# Patient Record
Sex: Male | Born: 1985 | Race: Black or African American | Hispanic: No | Marital: Single | Smoking: Current every day smoker
Health system: Southern US, Community
[De-identification: ages and names within clinical notes are randomized; demographics above are authoritative.]

## PROBLEM LIST (undated history)

## (undated) DIAGNOSIS — S0990XA Unspecified injury of head, initial encounter: Secondary | ICD-10-CM

---

## 2014-01-25 DIAGNOSIS — S0990XA Unspecified injury of head, initial encounter: Secondary | ICD-10-CM

## 2014-01-25 HISTORY — DX: Unspecified injury of head, initial encounter: S09.90XA

## 2014-02-20 ENCOUNTER — Emergency Department (HOSPITAL_COMMUNITY)
Admission: EM | Admit: 2014-02-20 | Discharge: 2014-02-20 | Disposition: A | Discharge status: Home / Self Care | Payer: Self-pay | Attending: Emergency Medicine | Admitting: Emergency Medicine

## 2014-02-20 ENCOUNTER — Emergency Department (HOSPITAL_COMMUNITY): Payer: Self-pay

## 2014-02-20 ENCOUNTER — Encounter (HOSPITAL_COMMUNITY): Payer: Self-pay | Admitting: Emergency Medicine

## 2014-02-20 DIAGNOSIS — S63610A Unspecified sprain of right index finger, initial encounter: Secondary | ICD-10-CM | POA: Insufficient documentation

## 2014-02-20 DIAGNOSIS — Y9389 Activity, other specified: Secondary | ICD-10-CM | POA: Insufficient documentation

## 2014-02-20 DIAGNOSIS — Z720 Tobacco use: Secondary | ICD-10-CM | POA: Insufficient documentation

## 2014-02-20 DIAGNOSIS — Y998 Other external cause status: Secondary | ICD-10-CM | POA: Insufficient documentation

## 2014-02-20 DIAGNOSIS — X58XXXA Exposure to other specified factors, initial encounter: Secondary | ICD-10-CM | POA: Insufficient documentation

## 2014-02-20 DIAGNOSIS — Y9289 Other specified places as the place of occurrence of the external cause: Secondary | ICD-10-CM | POA: Insufficient documentation

## 2014-02-20 MED ORDER — NAPROXEN 500 MG PO TABS: 500.0000 mg | ORAL_TABLET | Freq: Two times a day (BID) | ORAL | Status: DC

## 2014-02-20 MED ORDER — NAPROXEN 500 MG PO TABS
500.0000 mg | ORAL_TABLET | Freq: Once | ORAL | Status: AC
  Administered 2014-02-20: 500 mg via ORAL
  Filled 2014-02-20: qty 1

## 2014-02-20 NOTE — ED Provider Notes (Signed)
CSN: [id number]     Arrival date & time 02/20/14  2049 History   None    Chief Complaint  Patient presents with  . Hand Injury   Patient is a 29 y.o. male presenting with hand injury. The history is provided by the patient. No language interpreter was used.  Hand Injury This chart was scribed for non-physician practitioner Evan Madura, PA-C,  working with Audree Camel, MD, by Andrew Au, ED Scribe. This patient was seen in room WTR8/WTR8 and the patient's care was started at 9:24 PM.  Evan Rocha is a 29 y.o. male who presents to the Emergency Department complaining of aching, throbbing, nonradiating right index finger pain and swelling that began today. Pt states he woke up with right hand pain this morning. 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 Pt states after work her applied ice to right hand. Pt is unsure of injury.    History reviewed. No pertinent past medical history. History reviewed. No pertinent past surgical history. History reviewed. No pertinent family history. History  Substance Use Topics  . Smoking status: Current Every Day Smoker -- 0.50 packs/day    Types: Cigarettes  . Smokeless tobacco: Not on file  . Alcohol Use: Yes     Comment: occasionally    Review of Systems  Musculoskeletal: Positive for myalgias.  All other systems reviewed and are negative.   Allergies  Review of patient's allergies indicates no known allergies.  Home Medications   Prior to Admission medications   Medication Sig Start Date End Date Taking? Authorizing Provider  naproxen (NAPROSYN) 500 MG tablet Take 1 tablet (500 mg total) by mouth 2 (two) times daily. 02/20/14   Evan Madura, PA-C   BP 135/78 mmHg  Pulse 90  Temp(Src) 98.7 F (37.1 C) (Oral)  Resp 20  SpO2 98%   Physical Exam  Constitutional: He is oriented to person, place, and time. He appears well-developed and well-nourished. No distress.  HENT:  Head:  Normocephalic and atraumatic.  Eyes: Conjunctivae and EOM are normal. No scleral icterus.  Neck: Normal range of motion.  Cardiovascular: Normal rate, regular rhythm and intact distal pulses.   Distal radial pulse 2+ in the right upper extremity. Capillary refill brisk in all digits of right hand.  Pulmonary/Chest: Effort normal. No respiratory distress.  Musculoskeletal: He exhibits tenderness.       Right hand: He exhibits decreased range of motion (Decreased active range of motion secondary to swelling of right second finger), tenderness, bony tenderness and swelling (right second digit). He exhibits normal capillary refill, no deformity and no laceration. Normal sensation noted. Normal strength noted.       Hands: Swelling to the right second digit with tenderness to palpation to the second PIP joint. Normal passive range of motion of right second finger. No crepitus, deformity, erythema, or heat to touch. 5/5 strength against resistance of FDP, FDS, and extensors of right second digit.  Neurological: He is alert and oriented to person, place, and time. He exhibits normal muscle tone. Coordination normal.  Sensation to light touch intact. Patient able to wiggle all fingers of right hand. Finger to thumb opposition intact.  Skin: Skin is warm and dry. No rash noted. He is not diaphoretic. No erythema. No pallor.  Psychiatric: He has a normal mood and affect. His behavior is normal.  Nursing note and vitals reviewed.   ED Course  Procedures (including critical care time) DIAGNOSTIC STUDIES:  Oxygen Saturation is 100% on RA, normal by my interpretation.    COORDINATION OF CARE: 10:06 PM- Pt advised of plan for treatment and pt agrees.  Labs Review Labs Reviewed - No data to display  Imaging Review Dg Hand Complete Right  02/20/2014   CLINICAL DATA:  Initial encounter for right hand pain mainly in the index finger since this morning. No injury.  EXAM: RIGHT HAND - COMPLETE 3+ VIEW   COMPARISON:  None.  FINDINGS: There is no evidence of fracture or dislocation. There is no evidence of arthropathy or other focal bone abnormality. Soft tissues are unremarkable.  IMPRESSION: Negative.   Electronically Signed   By: Kennith CenterEric  Mansell M.D.   On: 02/20/2014 21:34     EKG Interpretation None      MDM   Final diagnoses:  Sprain of right index finger, initial encounter    29 year old nontoxic-appearing male presents to the emergency department for further evaluation of pain to his right second finger. No known trauma/injury. Patient is neurovascularly intact. Physical exam findings with negative x-ray suggest likely finger sprain. Patient to be treated with NSAIDs and RICE. Splint applied in ED. Return precautions discussed and provided. Patient agreeable to plan with no unaddressed concerns.  I personally performed the services described in this documentation, which was scribed in my presence. The recorded information has been reviewed and is accurate.    Evan MaduraKelly Dray Dente, PA-C 02/20/14 2209  Audree CamelScott T Goldston, MD 02/21/14 [phone]

## 2014-02-20 NOTE — ED Notes (Signed)
Pt presents with right hand and finger swelling. Works where he possibly strained his hand but is unsure of source of injury. Able to move right hand and right arm. No other issues/complaints.

## 2014-02-20 NOTE — Discharge Instructions (Signed)
Finger Sprain A finger sprain is a tear in one of the strong, fibrous tissues that connect the bones (ligaments) in your finger. The severity of the sprain depends on how much of the ligament is torn. The tear can be either partial or complete. CAUSES  Often, sprains are a result of a fall or accident. If you extend your hands to catch an object or to protect yourself, the force of the impact causes the fibers of your ligament to stretch too much. This excess tension causes the fibers of your ligament to tear. SYMPTOMS  You may have some loss of motion in your finger. Other symptoms include:  Bruising.  Tenderness.  Swelling. DIAGNOSIS  In order to diagnose finger sprain, your caregiver will physically examine your finger or thumb to determine how torn the ligament is. Your caregiver may also suggest an X-ray exam of your finger to make sure no bones are broken. TREATMENT  If your ligament is only partially torn, treatment usually involves keeping the finger in a fixed position (immobilization) for a short period. To do this, your caregiver will apply a bandage, cast, or splint to keep your finger from moving until it heals. For a partially torn ligament, the healing process usually takes 2 to 3 weeks. If your ligament is completely torn, you may need surgery to reconnect the ligament to the bone. After surgery a cast or splint will be applied and will need to stay on your finger or thumb for 4 to 6 weeks while your ligament heals. HOME CARE INSTRUCTIONS  Keep your injured finger elevated, when possible, to decrease swelling.  To ease pain and swelling, apply ice to your joint twice a day, for 2 to 3 days:  Put ice in a plastic bag.  Place a towel between your skin and the bag.  Leave the ice on for 15 minutes.  Only take over-the-counter or prescription medicine for pain as directed by your caregiver.  Do not wear rings on your injured finger.  Do not leave your finger unprotected  until pain and stiffness go away (usually 3 to 4 weeks).  Do not allow your cast or splint to get wet. Cover your cast or splint with a plastic bag when you shower or bathe. Do not swim.  Your caregiver may suggest special exercises for you to do during your recovery to prevent or limit permanent stiffness. SEEK IMMEDIATE MEDICAL CARE IF:  Your cast or splint becomes damaged.  Your pain becomes worse rather than better. MAKE SURE YOU:  Understand these instructions.  Will watch your condition.  Will get help right away if you are not doing well or get worse. Document Released: 02/19/2004 Document Revised: 04/05/2011 Document Reviewed: 09/14/2010 [facility] Patient Information 2015 [place], [place]. This information is not intended to replace advice given to you by your health care provider. Make sure you discuss any questions you have with your health care provider. RICE: Routine Care for Injuries The routine care of many injuries includes Rest, Ice, Compression, and Elevation (RICE). HOME CARE INSTRUCTIONS  Rest is needed to allow your body to heal. Routine activities can usually be resumed when comfortable. Injured tendons and bones can take up to 6 weeks to heal. Tendons are the cord-like structures that attach muscle to bone.  Ice following an injury helps keep the swelling down and reduces pain.  Put ice in a plastic bag.  Place a towel between your skin and the bag.  Leave the ice on for 15-20  minutes, 3-4 times a day, or as directed by your health care provider. Do this while awake, for the first 24 to 48 hours. After that, continue as directed by your caregiver.  Compression helps keep swelling down. It also gives support and helps with discomfort. If an elastic bandage has been applied, it should be removed and reapplied every 3 to 4 hours. It should not be applied tightly, but firmly enough to keep swelling down. Watch fingers or toes for swelling, bluish discoloration,  coldness, numbness, or excessive pain. If any of these problems occur, remove the bandage and reapply loosely. Contact your caregiver if these problems continue.  Elevation helps reduce swelling and decreases pain. With extremities, such as the arms, hands, legs, and feet, the injured area should be placed near or above the level of the heart, if possible. SEEK IMMEDIATE MEDICAL CARE IF:  You have persistent pain and swelling.  You develop redness, numbness, or unexpected weakness.  Your symptoms are getting worse rather than improving after several days. These symptoms may indicate that further evaluation or further X-rays are needed. Sometimes, X-rays may not show a small broken bone (fracture) until 1 week or 10 days later. Make a follow-up appointment with your caregiver. Ask when your X-ray results will be ready. Make sure you get your X-ray results. Document Released: 04/25/2000 Document Revised: 01/16/2013 Document Reviewed: 06/12/2010 [facility] Patient Information 2015 [place], [place]. This information is not intended to replace advice given to you by your health care provider. Make sure you discuss any questions you have with your health care provider. Buddy Taping You have a minor finger or toe injury. It can be managed by buddy taping. Buddy taping means the injured finger or toe is taped to a healthy uninjured adjacent finger or toe. Most minor fractures and dislocations of the smaller fingers and toes will heal in 3 to 4 weeks. Buddy taping immobilizes and protects the area of injury. Buddy taping is not recommended for initial treatment of fractures of the thumb, longer fingers, or the great toe. Buddy taping should not be used for unstable or deformed fractures, but as fracture healing progresses it may be used for protection during rehabilitation. Fractured fingers and toes should be protected by buddy taping as long as the injury is still painful or swollen.  When an injury is buddy  taped, place a small piece of gauze or cotton between the digits that are taped. This helps prevent the skin from breaking down from increased moisture. Buddy taping allows you to get your injury wet when you bathe. Change the gauze and tape more often if it gets wet, and dry the space between the finger or toes. Use a sturdy, hard-soled shoe for better support if you have a fractured toe. In 2 to 3 weeks you can start motion exercises. This will keep the fingers or toes from becoming stiff.  SEEK IMMEDIATE MEDICAL CARE IF:   The injured area becomes cold, numb, or pale.  You have pain not controlled with medications.  You notice increasing deformity of the toe or finger. Document Released: 02/19/2004 Document Revised: 04/05/2011 Document Reviewed: 06/19/2008 [facility]Exit[facility] Patient Information 2015 [place], [place]. This information is not intended to replace advice given to you by your health care provider. Make sure you discuss any questions you have with your health care provider.

## 2014-06-26 ENCOUNTER — Emergency Department (HOSPITAL_COMMUNITY)
Admission: EM | Admit: 2014-06-26 | Discharge: 2014-06-26 | Disposition: A | Discharge status: Home / Self Care | Payer: Self-pay | Attending: Emergency Medicine | Admitting: Emergency Medicine

## 2014-06-26 ENCOUNTER — Encounter (HOSPITAL_COMMUNITY): Payer: Self-pay | Admitting: Emergency Medicine

## 2014-06-26 DIAGNOSIS — Z87828 Personal history of other (healed) physical injury and trauma: Secondary | ICD-10-CM | POA: Insufficient documentation

## 2014-06-26 DIAGNOSIS — Z139 Encounter for screening, unspecified: Secondary | ICD-10-CM

## 2014-06-26 DIAGNOSIS — Z791 Long term (current) use of non-steroidal anti-inflammatories (NSAID): Secondary | ICD-10-CM | POA: Insufficient documentation

## 2014-06-26 DIAGNOSIS — Z0000 Encounter for general adult medical examination without abnormal findings: Secondary | ICD-10-CM | POA: Insufficient documentation

## 2014-06-26 DIAGNOSIS — Z720 Tobacco use: Secondary | ICD-10-CM | POA: Insufficient documentation
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
## 2014-06-28 ENCOUNTER — Telehealth: Payer: Self-pay | Admitting: Neurology

## 2014-06-28 ENCOUNTER — Ambulatory Visit: Payer: Self-pay | Admitting: Neurology

## 2014-06-28 NOTE — Telephone Encounter (Signed)
This patient did not show for a new patient appointment today. 

## 2014-07-02 ENCOUNTER — Encounter: Payer: Self-pay | Admitting: Neurology

## 2015-03-21 ENCOUNTER — Emergency Department (HOSPITAL_COMMUNITY): Payer: Self-pay

## 2015-03-21 ENCOUNTER — Encounter (HOSPITAL_COMMUNITY): Payer: Self-pay | Admitting: Oncology

## 2015-03-21 ENCOUNTER — Emergency Department (HOSPITAL_COMMUNITY)
Admission: EM | Admit: 2015-03-21 | Discharge: 2015-03-21 | Disposition: A | Discharge status: Home / Self Care | Payer: Self-pay | Attending: Emergency Medicine | Admitting: Emergency Medicine

## 2015-03-21 DIAGNOSIS — F17210 Nicotine dependence, cigarettes, uncomplicated: Secondary | ICD-10-CM | POA: Insufficient documentation

## 2015-03-21 DIAGNOSIS — J40 Bronchitis, not specified as acute or chronic: Secondary | ICD-10-CM

## 2015-03-21 DIAGNOSIS — J209 Acute bronchitis, unspecified: Secondary | ICD-10-CM | POA: Insufficient documentation

## 2015-03-21 DIAGNOSIS — Z87828 Personal history of other (healed) physical injury and trauma: Secondary | ICD-10-CM | POA: Insufficient documentation

## 2015-03-21 DIAGNOSIS — Z791 Long term (current) use of non-steroidal anti-inflammatories (NSAID): Secondary | ICD-10-CM | POA: Insufficient documentation

## 2015-03-21 HISTORY — DX: Unspecified injury of head, initial encounter: S09.90XA

## 2015-03-21 MED ORDER — ALBUTEROL SULFATE HFA 108 (90 BASE) MCG/ACT IN AERS
2.0000 | INHALATION_SPRAY | RESPIRATORY_TRACT | Status: DC | PRN
  Administered 2015-03-21: 2 via RESPIRATORY_TRACT
  Filled 2015-03-21: qty 6.7

## 2015-03-21 MED ORDER — IPRATROPIUM-ALBUTEROL 0.5-2.5 (3) MG/3ML IN SOLN
3.0000 mL | Freq: Once | RESPIRATORY_TRACT | Status: AC
  Administered 2015-03-21: 3 mL via RESPIRATORY_TRACT
  Filled 2015-03-21: qty 3

## 2015-03-21 MED ORDER — AZITHROMYCIN 250 MG PO TABS
500.0000 mg | ORAL_TABLET | Freq: Once | ORAL | Status: AC
  Administered 2015-03-21: 500 mg via ORAL
  Filled 2015-03-21: qty 2

## 2015-03-21 MED ORDER — AZITHROMYCIN 250 MG PO TABS: 250.0000 mg | ORAL_TABLET | Freq: Every day | ORAL | Status: DC

## 2015-03-21 NOTE — Discharge Instructions (Signed)

## 2015-03-21 NOTE — ED Notes (Signed)
Per pt he has had cough, body aches and sinus congestion.  Pt took mucinex at home w/o relief.

## 2015-03-21 NOTE — ED Provider Notes (Signed)
CSN: [id number]     Arrival date & time 03/21/15  2106 History  By signing my name below, I, Evan Rocha, attest that this documentation has been prepared under the direction and in the presence of Marlon Pel, PA-C. Electronically Signed: [name], Scribe. 03/21/2015. 10:48 PM.   Chief Complaint  Patient presents with  . URI   The history is provided by the patient. No language interpreter was used.   HPI Comments: Evan Rocha is a 30 y.o. male who presents to the Emergency Department complaining of sudden onset, constant, mild cough, generalized body aches, and sinus congestion which began four days ago but worsened three nights ago. He also reports associated minor edema of his throat, chills and diaphoresis, especially during the evenings. Pt has taken Mucinex, DayQuil, sinus relief, and cough drops at home with no relief.  Pt denies trouble swallowing, sore throat, and hx of asthma.   PCP: No primary care provider on file.  Blood pressure 136/92, pulse 94, temperature 99.1 F (37.3 C), temperature source Oral, resp. rate 20, height  (1.778 m), weight 108.863 kg, SpO2 95 %.  Negative ROS: Confusion, diaphoresis, fever, headache, lethargy, vision change, neck pain, dysphagia, aphagia, drooling, stridor, chest pain, shortness of breath,  back pain, abdominal pains, nausea, vomiting, constipation, dysuria, loc, diarrhea, lower extremity swelling, rash.   Past Medical History  Diagnosis Date  . Head injury 2016   History reviewed. No pertinent past surgical history. History reviewed. No pertinent family history. Social History  Substance Use Topics  . Smoking status: Current Every Day Smoker -- 0.50 packs/day    Types: Cigarettes  . Smokeless tobacco: Never Used  . Alcohol Use: Yes     Comment: occasionally    Review of Systems  Constitutional: Positive for chills and diaphoresis.  HENT: Positive for congestion. Negative for sore throat and trouble  swallowing.   Respiratory: Positive for cough.   Musculoskeletal: Positive for myalgias ( generalized body aches).  All other systems reviewed and are negative.  Allergies  Review of patient's allergies indicates no known allergies.  Home Medications   Prior to Admission medications   Medication Sig Start Date End Date Taking? Authorizing Provider  azithromycin (ZITHROMAX) 250 MG tablet Take 1 tablet (250 mg total) by mouth daily. Take first 2 tablets together, then 1 every day until finished. 03/21/15   Coron Rossano Neva Seat, PA-C  naproxen (NAPROSYN) 500 MG tablet Take 1 tablet (500 mg total) by mouth 2 (two) times daily. 02/20/14   Antony Madura, PA-C   BP 136/92 mmHg  Pulse 94  Temp(Src) 99.1 F (37.3 C) (Oral)  Resp 20  Ht  (1.778 m)  Wt 108.863 kg  BMI 34.44 kg/m2  SpO2 95% Physical Exam  Constitutional: He is oriented to person, place, and time. He appears well-developed and well-nourished. No distress.  HENT:  Head: Normocephalic and atraumatic.  Eyes: Conjunctivae are normal.  Cardiovascular: Normal rate, regular rhythm and normal heart sounds.   Pulmonary/Chest: Effort normal. No accessory muscle usage. No respiratory distress. He has no decreased breath sounds. He has wheezes. He has rhonchi. He has no rales.  Abdominal: Soft. He exhibits no distension. There is no tenderness.  Neurological: He is alert and oriented to person, place, and time.  Skin: Skin is warm and dry.  Psychiatric: He has a normal mood and affect.  Nursing note and vitals reviewed.   ED Course  Procedures  DIAGNOSTIC STUDIES:    Oxygen Saturation is 95%  on RA, adequate by my interpretation.   COORDINATION OF CARE:  10:48 PM Will review x-ray of pt's chest. Will treat pt with antibiotic and will administer pt breathing treatment in the ED. Will also prescribe pt nebulizer. Pt administered nebulizer in the ED. Discussed treatment plan with pt at bedside and pt agreed to plan.   Imaging  Review Dg Chest 2 View  03/21/2015  CLINICAL DATA:  30 year old presenting with 3 day history of cough, shortness of breath, myalgias and sinonasal congestion. Occasional smoker. EXAM: CHEST  2 VIEW COMPARISON:  None. FINDINGS: Cardiac silhouette upper normal in size. Hilar and mediastinal contours unremarkable otherwise. Lungs clear. Bronchovascular markings normal. Pulmonary vascularity normal. No visible pleural effusions. No pneumothorax. Visualized bony thorax intact. IMPRESSION: Borderline heart size.  No acute cardiopulmonary disease. Electronically Signed   By: Hulan Saas M.D.   On: 03/21/2015 21:48   I have personally reviewed and evaluated these images as part of my medical decision-making.  MDM   Final diagnoses:  Bronchitis    Pt with wheezing and rhonchi on exam. Clinically has bronchitis.  Medications  azithromycin (ZITHROMAX) tablet 500 mg (not administered)  albuterol (PROVENTIL HFA;VENTOLIN HFA) 108 (90 Base) MCG/ACT inhaler 2 puff (not administered)  ipratropium-albuterol (DUONEB) 0.5-2.5 (3) MG/3ML nebulizer solution 3 mL (3 mLs Nebulization Given 03/21/15 2250)   F/u with PCP in 7-14 days.  Return precautions discussed.   I feel the patient has had an appropriate workup for their chief complaint at this time and likelihood of emergent condition existing is low. Discussed s/sx that warrant return to the ED.  Filed Vitals:   03/21/15 2121  BP: 136/92  Pulse: 94  Temp: 99.1 F (37.3 C)  Resp: [address], PA-C 03/21/15 2313  Derwood Kaplan, MD 03/22/15 1536

## 2015-07-18 ENCOUNTER — Ambulatory Visit: Payer: Self-pay

## 2015-07-18 ENCOUNTER — Other Ambulatory Visit: Payer: Self-pay | Admitting: Occupational Medicine

## 2015-07-18 DIAGNOSIS — Z0000 Encounter for general adult medical examination without abnormal findings: Secondary | ICD-10-CM

## 2015-09-10 ENCOUNTER — Encounter (HOSPITAL_COMMUNITY): Payer: Self-pay | Admitting: Emergency Medicine

## 2015-09-10 ENCOUNTER — Emergency Department (HOSPITAL_COMMUNITY): Payer: Self-pay

## 2015-09-10 ENCOUNTER — Emergency Department (HOSPITAL_COMMUNITY)
Admission: EM | Admit: 2015-09-10 | Discharge: 2015-09-10 | Disposition: A | Discharge status: Home / Self Care | Payer: Self-pay | Attending: Emergency Medicine | Admitting: Emergency Medicine

## 2015-09-10 DIAGNOSIS — S6291XA Unspecified fracture of right wrist and hand, initial encounter for closed fracture: Secondary | ICD-10-CM

## 2015-09-10 DIAGNOSIS — W228XXA Striking against or struck by other objects, initial encounter: Secondary | ICD-10-CM | POA: Insufficient documentation

## 2015-09-10 DIAGNOSIS — Y999 Unspecified external cause status: Secondary | ICD-10-CM | POA: Insufficient documentation

## 2015-09-10 DIAGNOSIS — S62390A Other fracture of second metacarpal bone, right hand, initial encounter for closed fracture: Secondary | ICD-10-CM | POA: Insufficient documentation

## 2015-09-10 DIAGNOSIS — Y929 Unspecified place or not applicable: Secondary | ICD-10-CM | POA: Insufficient documentation

## 2015-09-10 DIAGNOSIS — Z791 Long term (current) use of non-steroidal anti-inflammatories (NSAID): Secondary | ICD-10-CM | POA: Insufficient documentation

## 2015-09-10 DIAGNOSIS — Y939 Activity, unspecified: Secondary | ICD-10-CM | POA: Insufficient documentation

## 2015-09-10 DIAGNOSIS — F17210 Nicotine dependence, cigarettes, uncomplicated: Secondary | ICD-10-CM | POA: Insufficient documentation

## 2015-09-10 NOTE — ED Notes (Signed)
Ortho tech called to apply splint. °

## 2015-09-10 NOTE — ED Triage Notes (Signed)
Pt states his right hand got slammed in a car door yesterday  Pt states he has iced and elevated it but the pain and swelling continue this morning

## 2015-09-10 NOTE — ED Provider Notes (Signed)
[facility]-EMERGENCY DEPT Provider Note   CSN: [id number] Arrival date & time: 09/10/15  [id number]     History   Chief Complaint Chief Complaint  Patient presents with  . Hand Injury    HPI Evan Rocha is a 30 y.o. male.  HPI  Patient presents with concern of pain in the dorsum of the right hand. If the presence since yesterday, after his hand was stuck in a door. Pain is focally about the hand, with no radiation, worse with motion, palpation of the area. No other injuries, no other complaints. No medication taken for pain relief, pain is sore.  Past Medical History:  Diagnosis Date  . Head injury 2016    There are no active problems to display for this patient.   History reviewed. No pertinent surgical history.     Home Medications    Prior to Admission medications   Medication Sig Start Date End Date Taking? Authorizing Provider  azithromycin (ZITHROMAX) 250 MG tablet Take 1 tablet (250 mg total) by mouth daily. Take first 2 tablets together, then 1 every day until finished. 03/21/15   Tiffany Neva SeatGreene, PA-C  naproxen (NAPROSYN) 500 MG tablet Take 1 tablet (500 mg total) by mouth 2 (two) times daily. 02/20/14   Antony MaduraKelly Humes, PA-C    Family History Family History  Problem Relation Age of Onset  . Hypertension Other     Social History Social History  Substance Use Topics  . Smoking status: Current Every Day Smoker    Packs/day: 0.50    Types: Cigarettes  . Smokeless tobacco: Never Used  . Alcohol use Yes     Comment: occasionally     Allergies   Review of patient's allergies indicates no known allergies.   Review of Systems Review of Systems  Constitutional:       Per HPI, otherwise negative  HENT:       Per HPI, otherwise negative  Respiratory:       Per HPI, otherwise negative  Cardiovascular:       Per HPI, otherwise negative  Gastrointestinal: Negative for vomiting.  Endocrine:       Negative aside from HPI  Genitourinary:       Neg aside from  HPI   Musculoskeletal:       Per HPI, otherwise negative  Skin: Negative.   Neurological: Negative for syncope.     Physical Exam Updated Vital Signs BP 142/82 (BP Location: Left Arm)   Pulse 83   Temp 98.7 F (37.1 C) (Oral)   Resp 16   Ht 5\' 10"  (1.778 m)   Wt 245 lb (111.1 kg)   SpO2 97%   BMI 35.15 kg/m   Physical Exam  Constitutional: He is oriented to person, place, and time. He appears well-developed. No distress.  HENT:  Head: Normocephalic and atraumatic.  Eyes: Conjunctivae and EOM are normal.  Cardiovascular: Normal rate and regular rhythm.   Pulmonary/Chest: Effort normal. No stridor. No respiratory distress.  Abdominal: He exhibits no distension.  Musculoskeletal: He exhibits no edema.       Right wrist: He exhibits normal range of motion, no tenderness, no bony tenderness and no swelling.       Arms: Neurological: He is alert and oriented to person, place, and time.  Skin: Skin is warm and dry.  Psychiatric: He has a normal mood and affect.  Nursing note and vitals reviewed.     Radiology Dg Hand Complete Right  Result Date: 09/10/2015 CLINICAL DATA:  Persistent  pain after closing hand in car door 1 day prior EXAM: RIGHT HAND - COMPLETE 3+ VIEW COMPARISON:  February 20, 2014 FINDINGS: Frontal oblique and lateral views were obtained. There is a nondisplaced spiral type fracture involving portions of the proximal and distal aspects of the second metacarpal. No other fracture. No dislocation. Joint spaces appear intact. IMPRESSION: Nondisplaced spiral type fracture second metacarpal. No other fracture. No dislocation. No apparent arthropathy Electronically Signed   By: Bretta BangWilliam  Woodruff III M.D.   On: 09/10/2015 07:57    Procedures ORTHOPEDIC INJURY TREATMENT Date/Time: 09/10/2015 9:11 AM Performed by: Gerhard MunchLOCKWOOD, Dequante Tremaine Authorized by: Gerhard MunchLOCKWOOD, Hutson Luft  Consent: Verbal consent obtained. Risks and benefits: risks, benefits and alternatives were  discussed Consent given by: patient Patient understanding: patient states understanding of the procedure being performed Patient consent: the patient's understanding of the procedure matches consent given Procedure consent: procedure consent matches procedure scheduled Relevant documents: relevant documents present and verified Test results: test results available and properly labeled Site marked: the operative site was marked Imaging studies: imaging studies available Required items: required blood products, implants, devices, and special equipment available Patient identity confirmed: verbally with patient Time out: Immediately prior to procedure a "time out" was called to verify the correct patient, procedure, equipment, support staff and site/side marked as required. Injury location: hand Location details: right hand Injury type: fracture Fracture type: second metacarpal Pre-procedure neurovascular assessment: neurovascularly intact Pre-procedure distal perfusion: normal Pre-procedure neurological function: normal Pre-procedure range of motion: reduced  Anesthesia: Local anesthesia used: no  Sedation: Patient sedated: no Manipulation performed: no Immobilization: splint Splint type: radial gutter Supplies used: Ortho-Glass Post-procedure neurovascular assessment: post-procedure neurovascularly intact Post-procedure distal perfusion: normal Post-procedure neurological function: normal Post-procedure range of motion: unchanged Patient tolerance: Patient tolerated the procedure well with no immediate complications Comments: Procedure performed with the ortho tech.  Well tolerated    (including critical care time)  I reviewed the patient's x-ray imaging with him.  We discussed the need for splint, orthopedic follow-up.  Patient tolerated splinting of his hand fracture well   Initial Impression / Assessment and Plan / ED Course  I have reviewed the triage vital signs  and the nursing notes.  Pertinent labs & imaging results that were available during my care of the patient were reviewed by me and considered in my medical decision making (see chart for details).  Patient is generally well presenting with new right hand pain following minor trauma yesterday. Here the patient has no distal neurovascular deficits, does have fracture. Patient had successful immobilization with splint, this was well tolerated. Patient discharged in stable condition to follow-up with orthopedics.    Gerhard Munchobert Shalia Bartko, MD 09/10/15 [phone]

## 2015-12-30 ENCOUNTER — Emergency Department (HOSPITAL_COMMUNITY)
Admission: EM | Admit: 2015-12-30 | Discharge: 2015-12-30 | Disposition: A | Discharge status: Home / Self Care | Payer: Self-pay | Attending: Emergency Medicine | Admitting: Emergency Medicine

## 2015-12-30 ENCOUNTER — Encounter (HOSPITAL_COMMUNITY): Payer: Self-pay | Admitting: Emergency Medicine

## 2015-12-30 DIAGNOSIS — F17210 Nicotine dependence, cigarettes, uncomplicated: Secondary | ICD-10-CM | POA: Insufficient documentation

## 2015-12-30 DIAGNOSIS — K0889 Other specified disorders of teeth and supporting structures: Secondary | ICD-10-CM | POA: Insufficient documentation

## 2015-12-30 MED ORDER — NAPROXEN 500 MG PO TABS: 500.0000 mg | ORAL_TABLET | Freq: Two times a day (BID) | ORAL | 0 refills | Status: DC

## 2015-12-30 MED ORDER — NAPROXEN 500 MG PO TABS
500.0000 mg | ORAL_TABLET | Freq: Once | ORAL | Status: AC
  Administered 2015-12-30: 500 mg via ORAL
  Filled 2015-12-30: qty 1

## 2015-12-30 MED ORDER — PENICILLIN V POTASSIUM 500 MG PO TABS
500.0000 mg | ORAL_TABLET | Freq: Once | ORAL | Status: AC
  Administered 2015-12-30: 500 mg via ORAL
  Filled 2015-12-30: qty 1

## 2015-12-30 MED ORDER — ACETAMINOPHEN 500 MG PO TABS
1000.0000 mg | ORAL_TABLET | Freq: Once | ORAL | Status: AC
  Administered 2015-12-30: 1000 mg via ORAL
  Filled 2015-12-30: qty 2

## 2015-12-30 MED ORDER — ACETAMINOPHEN 500 MG PO TABS: 1000.0000 mg | ORAL_TABLET | Freq: Four times a day (QID) | ORAL | 0 refills | Status: DC | PRN

## 2015-12-30 MED ORDER — PENICILLIN V POTASSIUM 500 MG PO TABS: 500.0000 mg | ORAL_TABLET | Freq: Four times a day (QID) | ORAL | 0 refills | Status: AC

## 2015-12-30 NOTE — ED Provider Notes (Signed)
[facility]-EMERGENCY DEPT Provider Note   CSN: [id number] Arrival date & time: 12/30/15  1501  By signing my name below, I, Javier Dockerobert Ryan Halas, attest that this documentation has been prepared under the direction and in the presence of Cheri FowlerKayla Simisola Sandles, PA-C. Electronically Signed: Javier Dockerobert Ryan Halas, ER Scribe. 09/06/2015. 3:23 PM.  History   Chief Complaint Chief Complaint  Patient presents with  . Dental Pain   HPI  HPI Comments: Evan Rocha is a 30 y.o. male who presents to the Emergency Department complaining of gradual onset, constant, moderate upper right sided tooth pain with associated subjective fever and right sided facial swelling. The dental pain started 10 days ago and has steadily worsened. He states he felt warm last night. He denies dysphagia, choking, drooling, tongue swelling, or neck pain. No allergies.   Past Medical History:  Diagnosis Date  . Head injury 2016    There are no active problems to display for this patient.   History reviewed. No pertinent surgical history.   Home Medications    Prior to Admission medications   Medication Sig Start Date End Date Taking? Authorizing Provider  acetaminophen (TYLENOL) 500 MG tablet Take 2 tablets (1,000 mg total) by mouth every 6 (six) hours as needed. 12/30/15   Cheri FowlerKayla Baldo Hufnagle, PA-C  azithromycin (ZITHROMAX) 250 MG tablet Take 1 tablet (250 mg total) by mouth daily. Take first 2 tablets together, then 1 every day until finished. 03/21/15   Tiffany Neva SeatGreene, PA-C  naproxen (NAPROSYN) 500 MG tablet Take 1 tablet (500 mg total) by mouth 2 (two) times daily. 12/30/15   Cheri FowlerKayla Gelene Recktenwald, PA-C  penicillin v potassium (VEETID) 500 MG tablet Take 1 tablet (500 mg total) by mouth 4 (four) times daily. 12/30/15 01/06/16  Cheri FowlerKayla Reilly Blades, PA-C    Family History Family History  Problem Relation Age of Onset  . Hypertension Other     Social History Social History  Substance Use Topics  . Smoking status: Current Every Day Smoker    Packs/day: 0.50    Types: Cigarettes  . Smokeless tobacco: Never Used  . Alcohol use Yes     Comment: occasionally     Allergies   Patient has no known allergies.   Review of Systems Review of Systems  Constitutional: Positive for fever. Negative for chills.  HENT: Positive for dental problem. Negative for ear pain and sinus pain.   Gastrointestinal: Negative for nausea and vomiting.  All other systems reviewed and are negative.    Physical Exam Updated Vital Signs BP 128/96 (BP Location: Right Arm)   Pulse 63   Temp 98.3 F (36.8 C) (Oral)   Resp 18   Ht 5\' 10"  (1.778 m)   Wt 111.1 kg   SpO2 98%   BMI 35.15 kg/m   Physical Exam  Constitutional: He is oriented to person, place, and time. He appears well-developed and well-nourished. No distress.  HENT:  Head: Normocephalic and atraumatic.  Mouth/Throat: Uvula is midline, oropharynx is clear and moist and mucous membranes are normal. No trismus in the jaw. Abnormal dentition. Dental caries present.  Minimal right sided facial swelling.  No tongue swelling.  Right upper first molar with mild decay.  No obvious abscess. Left upper 3rd molar decayed.  Airway patent. Patient tolerating secretions without difficulty.   Eyes: Conjunctivae are normal. Pupils are equal, round, and reactive to light.  Neck: Normal range of motion. Neck supple.  No evidence of Ludwigs angina.  Cardiovascular: Normal rate, regular rhythm and normal heart sounds.  Pulmonary/Chest: Effort normal and breath sounds normal. No respiratory distress.  Abdominal: Bowel sounds are normal. He exhibits no distension.  Musculoskeletal: Normal range of motion.  Moves all extremities spontaneously.  Lymphadenopathy:    He has no cervical adenopathy.  Neurological: He is alert and oriented to person, place, and time. Coordination normal.  Speech clear without dysarthria.   Skin: Skin is warm and dry. He is not diaphoretic.  Psychiatric: He has a normal mood and affect. His  behavior is normal.  Nursing note and vitals reviewed.   ED Treatments / Results  Labs (all labs ordered are listed, but only abnormal results are displayed) Labs Reviewed - No data to display  EKG  EKG Interpretation None       Radiology No results found.  Procedures Procedures (including critical care time)  Medications Ordered in ED Medications  acetaminophen (TYLENOL) tablet 1,000 mg (not administered)  naproxen (NAPROSYN) tablet 500 mg (not administered)     Initial Impression / Assessment and Plan / ED Course  I have reviewed the triage vital signs and the nursing notes.  Pertinent labs & imaging results that were available during my care of the patient were reviewed by me and considered in my medical decision making (see chart for details).  Clinical Course    Patient with dentalgia.  No abscess requiring immediate incision and drainage.  Exam not concerning for Ludwig's angina or pharyngeal abscess.  Will treat with penicillin. Pt instructed to follow-up with dentist.  Discussed return precautions. Pt safe for discharge.  Final Clinical Impressions(s) / ED Diagnoses   Final diagnoses:  Pain, dental    New Prescriptions New Prescriptions   ACETAMINOPHEN (TYLENOL) 500 MG TABLET    Take 2 tablets (1,000 mg total) by mouth every 6 (six) hours as needed.   NAPROXEN (NAPROSYN) 500 MG TABLET    Take 1 tablet (500 mg total) by mouth 2 (two) times daily.   PENICILLIN V POTASSIUM (VEETID) 500 MG TABLET    Take 1 tablet (500 mg total) by mouth 4 (four) times daily.    I personally performed the services described in this documentation, which was scribed in my presence. The recorded information has been reviewed and is accurate.       Cheri FowlerKayla Amiah Frohlich, PA-C 12/30/15 1533    Nira ConnPedro Eduardo Cardama, MD 01/01/16 1159

## 2015-12-30 NOTE — Discharge Instructions (Signed)
Please follow up with a Dentist for further evaluation and management.   Call [phone] for Emergent Dental Care  Check this website for free, low-income or sliding scale dental services in [place]. www.freedental.us   To find a dentist in the [place] or surrounding areas check this website: [email or web address]/[email or web address]-the-public/[email or web address]-[email or web address]  Take Naproxen twice daily for pain.  You may also take 1000 mg of Tylenol every 6 hours.  DO NOT EXCEED MORE THAN 4000 MG OF TYLENOL IN 24 HOURS.  Take Penicillin 4 times daily for the next 7 days.   Return to the ED for sudden worsening pain, fever, chills, tongue swelling, facial swelling, choking/difficulty swallowing, or any new or concerning symptoms.

## 2015-12-30 NOTE — ED Triage Notes (Signed)
Patient is complaining of right upper dental pain starting last week. Patient denies trouble swallowing. Patient speaking in full sentences.

## 2016-03-23 ENCOUNTER — Emergency Department (HOSPITAL_COMMUNITY)
Admission: EM | Admit: 2016-03-23 | Discharge: 2016-03-23 | Disposition: A | Discharge status: Home / Self Care | Payer: Self-pay | Attending: Emergency Medicine | Admitting: Emergency Medicine

## 2016-03-23 ENCOUNTER — Encounter (HOSPITAL_COMMUNITY): Payer: Self-pay | Admitting: Emergency Medicine

## 2016-03-23 DIAGNOSIS — J069 Acute upper respiratory infection, unspecified: Secondary | ICD-10-CM | POA: Insufficient documentation

## 2016-03-23 DIAGNOSIS — F17210 Nicotine dependence, cigarettes, uncomplicated: Secondary | ICD-10-CM | POA: Insufficient documentation

## 2016-03-23 NOTE — ED Triage Notes (Addendum)
Pt reports fever, chills, body aches and cough since Saturday. Reports he feels that symptoms are improving. Took theraflu this am

## 2016-03-23 NOTE — ED Notes (Signed)
Patient comes in with flu-like symptoms that started Sat. Patient c/o cough w/ clear sputum, diarrhea starting yest, h/a, fatigue, body aches, and believes he has had fevers. Patient took tamiflu at home.

## 2016-03-23 NOTE — Discharge Instructions (Signed)
Please continue your symptom and troponin at home. Make sure to drink plenty of fluids at home, at least 8 glasses of water a day. Follow up with a primary care provider and the list attached. To use Tylenol or ibuprofen as needed for pain and fevers.  Contact a health care provider if: You are getting worse rather than better. Your symptoms are not controlled by medicine. You have chills. You have worsening shortness of breath. You have brown or red mucus. You have yellow or brown nasal discharge. You have pain in your face, especially when you bend forward. You have a fever. You have swollen neck glands. You have pain while swallowing. You have white areas in the back of your throat. Get help right away if: You have severe or persistent: Headache. Ear pain. Sinus pain. Chest pain. You have chronic lung disease and any of the following: Wheezing. Prolonged cough. Coughing up blood. A change in your usual mucus. You have a stiff neck. You have changes in your: Vision. Hearing. Thinking. Mood.

## 2016-03-23 NOTE — ED Provider Notes (Signed)
[facility]EMERGENCY DEPT Provider Note   CSN: [id number] Arrival date & time: 03/23/16  1257  By signing my name below, I, Freida Busmaniana Omoyeni, attest that this documentation has been prepared under the direction and in the presence of Francisco Espina, [place]. Electronically Signed: Freida Busmaniana Omoyeni, Scribe. 03/23/2016. 2:10 PM.  History   Chief Complaint Chief Complaint  Patient presents with  . Influenza    The history is provided by the patient. No language interpreter was used.     HPI Comments:  Evan Rocha is a 31 y.o. male who presents to the Emergency Department complaining of productive cough with yellow sputum x 4 days. He reports associated fatigue, body aches, and  HA. He states day 2  has been the worse day since his symptoms began but notes symptoms have not yet completely resolved which concerned him. Pt also notes 7 episodes of diarrhea since onset yesterday, decreased appetite, and subjective fever that has resolved. He has taken Theraflu and his allergy meds with mild relief. No h/o COPD, asthma, or smoking.   Past Medical History:  Diagnosis Date  . Head injury 2016    There are no active problems to display for this patient.   History reviewed. No pertinent surgical history.   Home Medications    Prior to Admission medications   Medication Sig Start Date End Date Taking? Authorizing Provider  acetaminophen (TYLENOL) 500 MG tablet Take 2 tablets (1,000 mg total) by mouth every 6 (six) hours as needed. 12/30/15   Cheri FowlerKayla Rose, PA-C  azithromycin (ZITHROMAX) 250 MG tablet Take 1 tablet (250 mg total) by mouth daily. Take first 2 tablets together, then 1 every day until finished. 03/21/15   Tiffany Neva SeatGreene, PA-C  naproxen (NAPROSYN) 500 MG tablet Take 1 tablet (500 mg total) by mouth 2 (two) times daily. 12/30/15   Cheri FowlerKayla Rose, PA-C    Family History Family History  Problem Relation Age of Onset  . Hypertension Other     Social History Social History  Substance Use Topics   . Smoking status: Current Every Day Smoker    Packs/day: 0.50    Types: Cigarettes  . Smokeless tobacco: Never Used  . Alcohol use Yes     Comment: occasionally     Allergies   Patient has no known allergies.   Review of Systems Review of Systems  Constitutional: Positive for appetite change, fatigue and fever (resolved).  Respiratory: Positive for cough.   Gastrointestinal: Positive for diarrhea. Negative for nausea and vomiting.  Musculoskeletal: Positive for myalgias.  Neurological: Positive for headaches.    Physical Exam Updated Vital Signs BP 127/93   Pulse 88   Temp 98.6 F (37 C)   Resp 12   SpO2 99%   Physical Exam  Constitutional: He is oriented to person, place, and time. He appears well-developed and well-nourished. No distress.  Well appearing  HENT:  Head: Normocephalic and atraumatic.  Right Ear: External ear normal.  Left Ear: External ear normal.  Nose: Nose normal.  Mouth/Throat: Oropharynx is clear and moist. No oropharyngeal exudate.  Oropharynx without evidence of redness or exudates. Tonsils without evidence of redness, swelling, or exudates. TM's appear normal with no evidence of bulging. EAC appear non erythematous and not swollen  Eyes: Conjunctivae and EOM are normal. Pupils are equal, round, and reactive to light.  Neck: Normal range of motion.  Normal ROM. No nuchal rigidity.   Cardiovascular: Normal rate and normal heart sounds.   Pulmonary/Chest: Effort normal and breath sounds normal.  No respiratory distress. He has no wheezes. He has no rales.  Lungs CTA. No wheezing. No rales. No stridor. Normal work of breathing  Abdominal: Soft. He exhibits no distension. There is no tenderness. There is no rebound and no guarding.  Soft and nontender. No rebound. No guarding. Negative murphy's sign. No focal tenderness at McBurney's point. No CVA tenderness. No evidence of hernia  Neurological: He is alert and oriented to person, place, and time.    Skin: Skin is warm and dry.  Psychiatric: He has a normal mood and affect. His behavior is normal.  Nursing note and vitals reviewed.   ED Treatments / Results  DIAGNOSTIC STUDIES:  Oxygen Saturation is 99% on RA, normal by my interpretation.    COORDINATION OF CARE:  2:05 PM Discussed treatment plan with pt at bedside and pt agreed to plan.  Labs (all labs ordered are listed, but only abnormal results are displayed) Labs Reviewed - No data to display  EKG  EKG Interpretation None       Radiology No results found.  Procedures Procedures (including critical care time)  Medications Ordered in ED Medications - No data to display   Initial Impression / Assessment and Plan / ED Course  I have reviewed the triage vital signs and the nursing notes.  Pertinent labs & imaging results that were available during my care of the patient were reviewed by me and considered in my medical decision making (see chart for details).    Patients symptoms are consistent with URI, likely viral etiology.  On exam, pt in NAD. No hypoxia. Afebrile. Lungs clear, Heart sounds clear. Normal work of breathing. TMs clear. Throat benign. Abdomen nontender/soft. Due to history and presentation I do not believe chest xray is needed. Discussed that antibiotics are not indicated for viral infections. Pt will be discharged with symptomatic treatment.  Verbalizes understanding and is agreeable with plan. Pt is hemodynamically stable & in NAD prior to dc. Reasons to immediately return to emergency department discussed.  MDM Number of Diagnoses or Management Options Upper respiratory tract infection, unspecified type:    Final Clinical Impressions(s) / ED Diagnoses   Final diagnoses:  Upper respiratory tract infection, unspecified type    New Prescriptions New Prescriptions   No medications on file   I personally performed the services described in this documentation, which was scribed in my  presence. The recorded information has been reviewed and is accurate.    [address] [place], [place] 03/23/16 1434    Gerhard Munch, MD 03/23/16 2029

## 2016-07-05 ENCOUNTER — Emergency Department (HOSPITAL_COMMUNITY): Admission: EM | Admit: 2016-07-05 | Discharge: 2016-07-06 | Discharge status: Against Medical Advice | Payer: Self-pay

## 2016-07-06 ENCOUNTER — Emergency Department (HOSPITAL_COMMUNITY)
Admission: EM | Admit: 2016-07-06 | Discharge: 2016-07-06 | Disposition: A | Discharge status: Home / Self Care | Payer: Self-pay | Attending: Emergency Medicine | Admitting: Emergency Medicine

## 2016-07-06 ENCOUNTER — Encounter (HOSPITAL_COMMUNITY): Payer: Self-pay | Admitting: Emergency Medicine

## 2016-07-06 DIAGNOSIS — Z79899 Other long term (current) drug therapy: Secondary | ICD-10-CM | POA: Insufficient documentation

## 2016-07-06 DIAGNOSIS — F17210 Nicotine dependence, cigarettes, uncomplicated: Secondary | ICD-10-CM | POA: Insufficient documentation

## 2016-07-06 DIAGNOSIS — K0889 Other specified disorders of teeth and supporting structures: Secondary | ICD-10-CM | POA: Insufficient documentation

## 2016-07-06 MED ORDER — PENICILLIN V POTASSIUM 250 MG PO TABS: 250.0000 mg | ORAL_TABLET | Freq: Four times a day (QID) | ORAL | 0 refills | Status: AC

## 2016-07-06 NOTE — ED Provider Notes (Signed)
[facility] Provider Note   CSN: [id number] Arrival date & time: 07/06/16  1101  By signing my name below, I, Cynda AcresHailei Fulton, attest that this documentation has been prepared under the direction and in the presence of Teressa LowerVrinda Jaleeah Slight, NP. Electronically Signed: Cynda AcresHailei Fulton, Scribe. 07/06/16. 12:26 PM.  History   Chief Complaint Chief Complaint  Patient presents with  . Dental Pain    HPI Comments: Evan Rocha is a 31 y.o. male with no pertinent past medical history, who presents to the Emergency Department complaining of sudden-onset, constant right upper dental pain that began several days ago. Patient states he began having worsening dental pain for several days. Patient reports having dental infections in the past. Patient reports associated left sided facial swelling. Patient reports taking steroids with no relief. Patient denies any fever, chills, sore throat, nausea, vomiting, trouble swallowing, or any additional symptoms.   The history is provided by the patient. No language interpreter was used.    Past Medical History:  Diagnosis Date  . Head injury 2016    There are no active problems to display for this patient.   History reviewed. No pertinent surgical history.     Home Medications    Prior to Admission medications   Medication Sig Start Date End Date Taking? Authorizing Provider  acetaminophen (TYLENOL) 500 MG tablet Take 2 tablets (1,000 mg total) by mouth every 6 (six) hours as needed. 12/30/15   Cheri Fowlerose, Kayla, PA-C  azithromycin (ZITHROMAX) 250 MG tablet Take 1 tablet (250 mg total) by mouth daily. Take first 2 tablets together, then 1 every day until finished. 03/21/15   Marlon PelGreene, Tiffany, PA-C  naproxen (NAPROSYN) 500 MG tablet Take 1 tablet (500 mg total) by mouth 2 (two) times daily. 12/30/15   Cheri Fowlerose, Kayla, PA-C    Family History Family History  Problem Relation Age of Onset  . Hypertension Other     Social History Social History  Substance  Use Topics  . Smoking status: Current Every Day Smoker    Packs/day: 0.50    Types: Cigarettes, Cigars  . Smokeless tobacco: Never Used  . Alcohol use Yes     Comment: occasionally     Allergies   Patient has no known allergies.   Review of Systems Review of Systems  Constitutional: Negative for chills and fever.  HENT: Positive for dental problem. Negative for sore throat and trouble swallowing.   Gastrointestinal: Negative for nausea and vomiting.  All other systems reviewed and are negative.    Physical Exam Updated Vital Signs BP (!) 148/98   Pulse 80   Temp 98.6 F (37 C) (Oral)   Resp 16   SpO2 99%   Physical Exam  Constitutional: He is oriented to person, place, and time. He appears well-developed.  HENT:  Head: Normocephalic and atraumatic.  Mouth/Throat: Oropharynx is clear and moist.  genneralized right sided decay. No definite swelling or abscess noted  Eyes: Conjunctivae and EOM are normal. Pupils are equal, round, and reactive to light.  Neck: Normal range of motion. Neck supple.  Cardiovascular: Normal rate and regular rhythm.   Pulmonary/Chest: Effort normal and breath sounds normal.  Abdominal: Soft. Bowel sounds are normal.  Musculoskeletal: Normal range of motion.  Neurological: He is alert and oriented to person, place, and time.  Skin: Skin is warm and dry.  Psychiatric: He has a normal mood and affect.  Nursing note and vitals reviewed.    ED Treatments / Results  DIAGNOSTIC STUDIES: Oxygen Saturation is  99% on RA, normal by my interpretation.    COORDINATION OF CARE: 12:25 PM Discussed treatment plan with pt at bedside and pt agreed to plan, which includes antibiotics.  Labs (all labs ordered are listed, but only abnormal results are displayed) Labs Reviewed - No data to display  EKG  EKG Interpretation None       Radiology No results found.  Procedures Procedures (including critical care time)  Medications Ordered in  ED Medications - No data to display   Initial Impression / Assessment and Plan / ED Course  I have reviewed the triage vital signs and the nursing notes.  Pertinent labs & imaging results that were available during my care of the patient were reviewed by me and considered in my medical decision making (see chart for details).     Pt send home with pcn and dental follow up  Final Clinical Impressions(s) / ED Diagnoses   Final diagnoses:  Pain, dental    New Prescriptions New Prescriptions   No medications on file   I personally performed the services described in this documentation, which was scribed in my presence. The recorded information has been reviewed and is accurate.     Teressa Lower, NP 07/06/16 1254    Charlynne Pander, MD 07/07/16 0700

## 2016-07-06 NOTE — ED Notes (Signed)
No answer when called for triage 

## 2016-07-06 NOTE — ED Notes (Signed)
See EDP secondary assessment.  

## 2016-07-06 NOTE — ED Notes (Signed)
No answer when called for triage x2 

## 2016-07-06 NOTE — ED Triage Notes (Signed)
Toothache in right upper molar - small cavity obvious, states face hurts and is swollen.

## 2018-07-15 IMAGING — CR DG HAND COMPLETE 3+V*R*
3 series · 3 of 3 positions shown · non-contrast
Comparison: February 20, 2014

CLINICAL DATA: Persistent pain after closing hand in car door 1 day
prior

EXAM:
RIGHT HAND - COMPLETE 3+ VIEW

[x hand pa right]
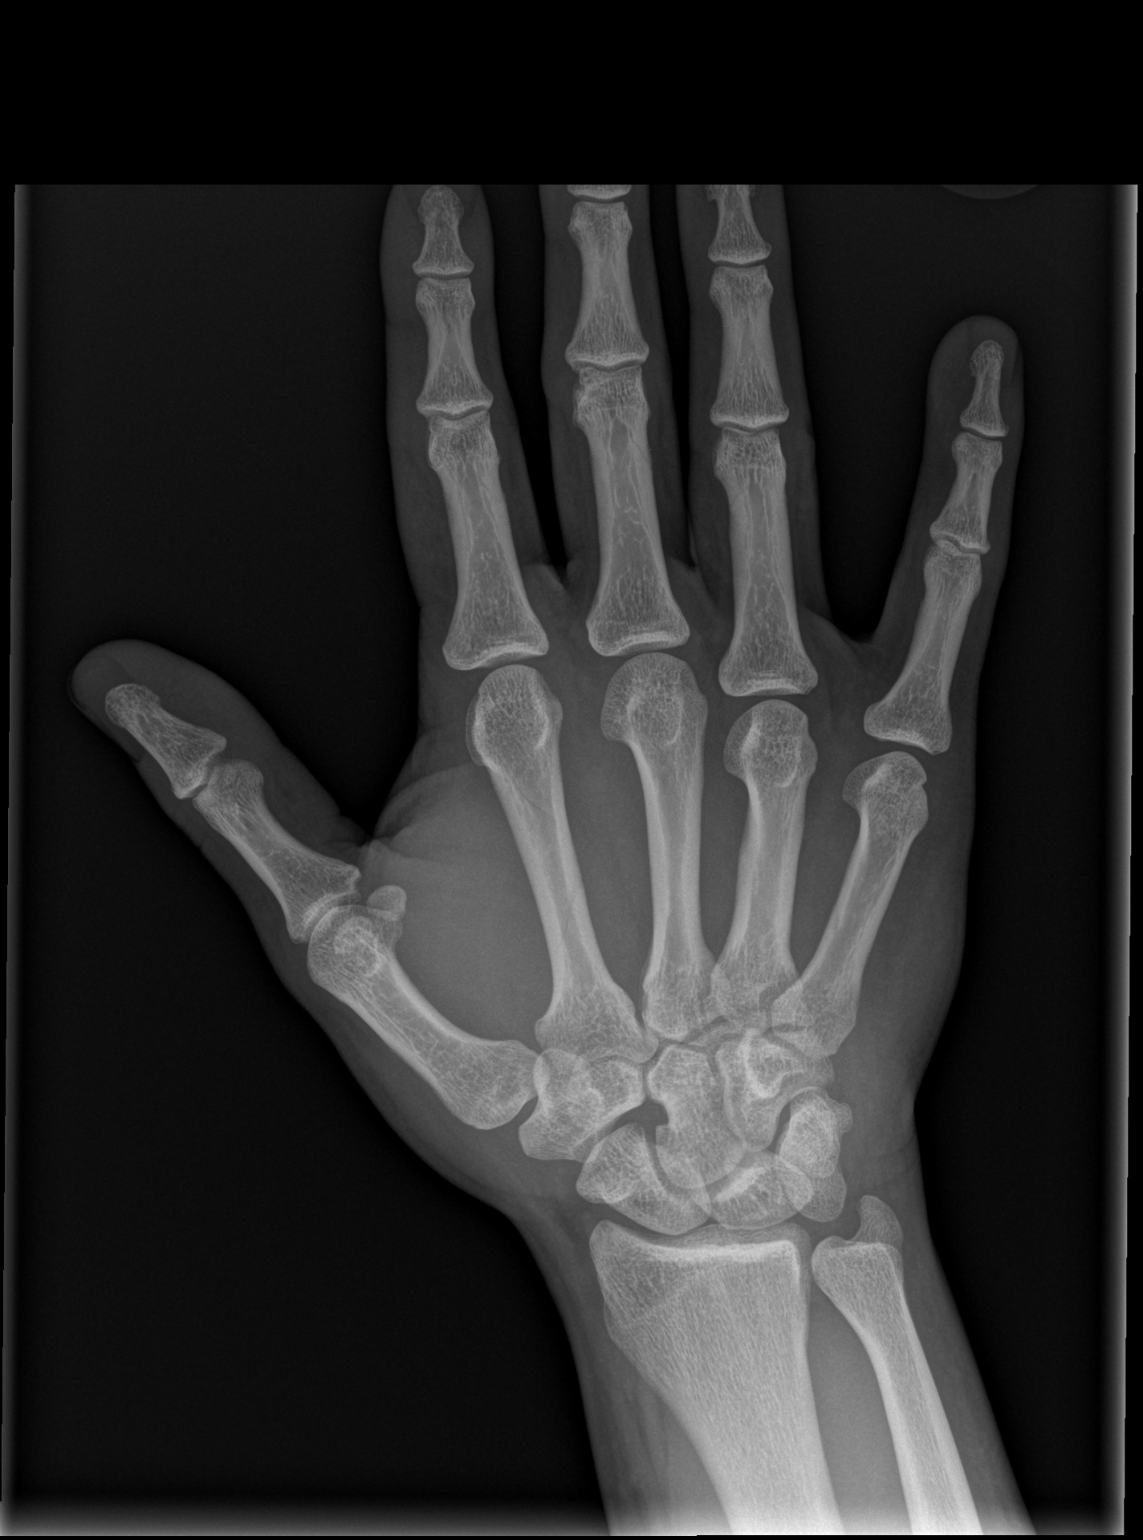

[x hand obl right]
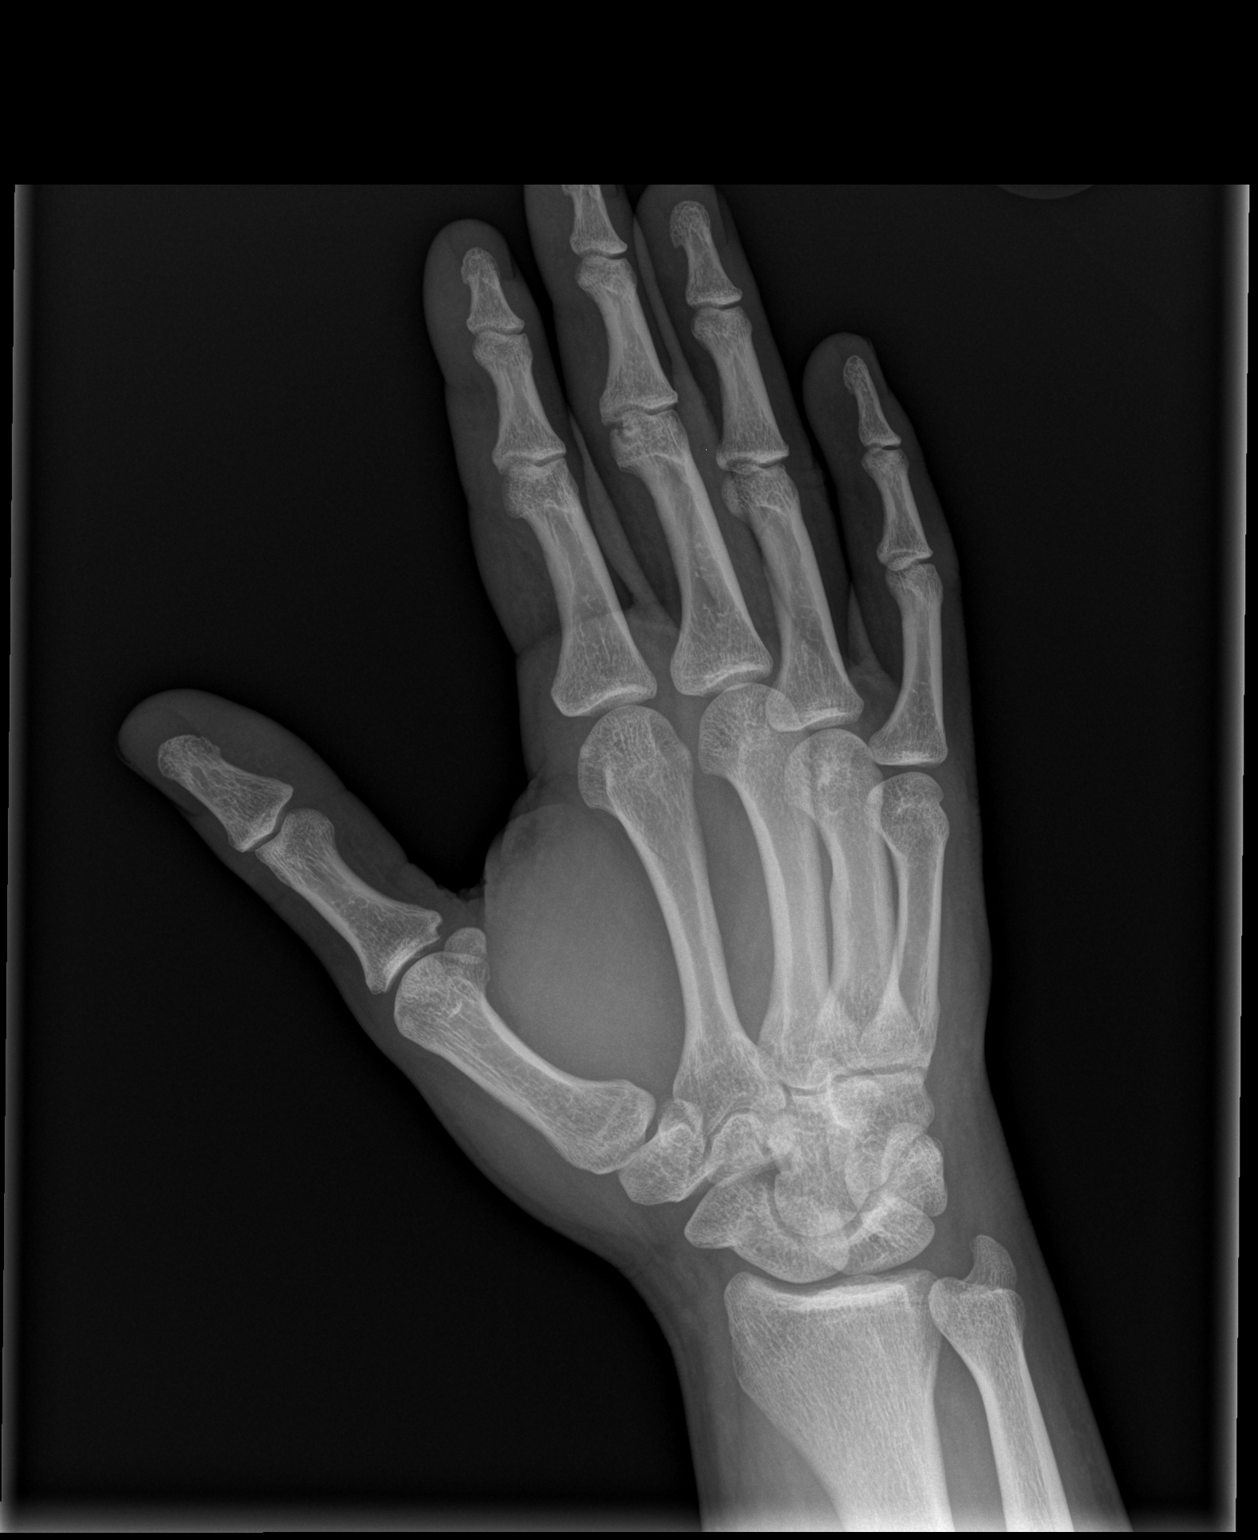

[x hand lat right]
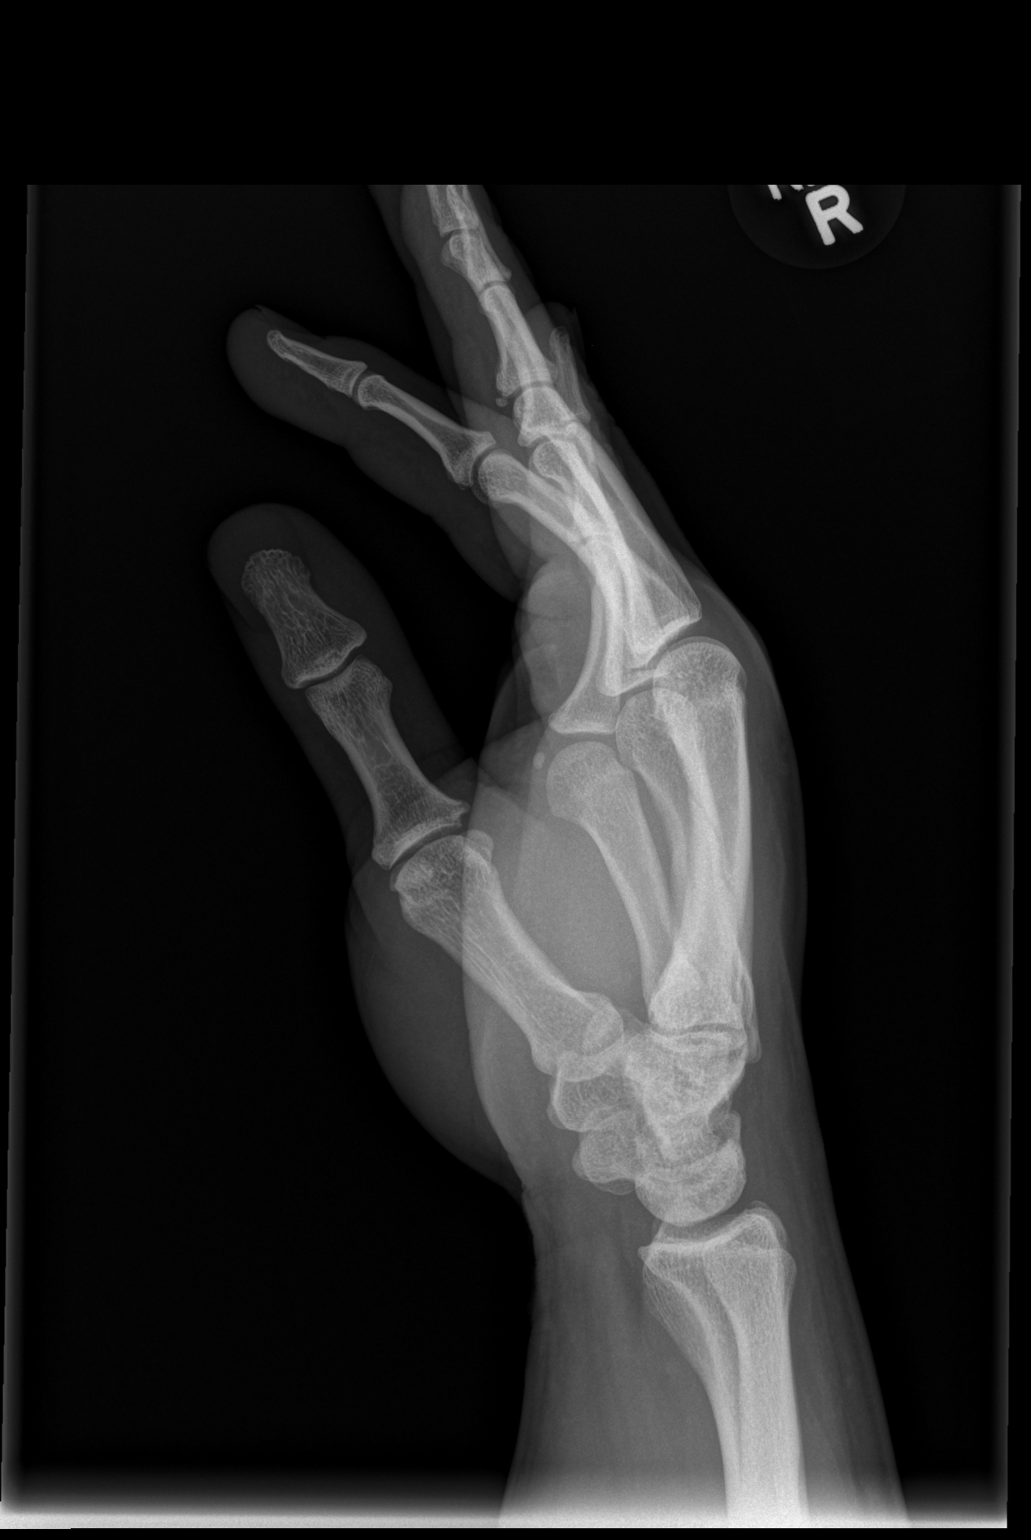

[3 of 3 positions shown; findings below may reference images not displayed]

FINDINGS: Frontal oblique and lateral views were obtained. There is a
nondisplaced spiral type fracture involving portions of the proximal
and distal aspects of the second metacarpal. No other fracture. No
dislocation. Joint spaces appear intact.
IMPRESSION: Nondisplaced spiral type fracture second metacarpal. No other
fracture. No dislocation. No apparent arthropathy

## 2019-10-11 ENCOUNTER — Emergency Department (HOSPITAL_BASED_OUTPATIENT_CLINIC_OR_DEPARTMENT_OTHER)
Admission: EM | Admit: 2019-10-11 | Discharge: 2019-10-11 | Disposition: A | Discharge status: Home / Self Care | Payer: BC Managed Care – PPO | Attending: Emergency Medicine | Admitting: Emergency Medicine

## 2019-10-11 ENCOUNTER — Encounter (HOSPITAL_BASED_OUTPATIENT_CLINIC_OR_DEPARTMENT_OTHER): Payer: Self-pay | Admitting: Emergency Medicine

## 2019-10-11 ENCOUNTER — Other Ambulatory Visit: Payer: Self-pay

## 2019-10-11 DIAGNOSIS — K0889 Other specified disorders of teeth and supporting structures: Secondary | ICD-10-CM | POA: Insufficient documentation

## 2019-10-11 DIAGNOSIS — Z5321 Procedure and treatment not carried out due to patient leaving prior to being seen by health care provider: Secondary | ICD-10-CM | POA: Diagnosis not present

## 2019-10-11 NOTE — ED Triage Notes (Addendum)
R upper dental pain x 3 days. He took ibuprofen 1 hour PTA

## 2020-08-12 ENCOUNTER — Emergency Department (HOSPITAL_BASED_OUTPATIENT_CLINIC_OR_DEPARTMENT_OTHER): Payer: BC Managed Care – PPO

## 2020-08-12 ENCOUNTER — Emergency Department (HOSPITAL_BASED_OUTPATIENT_CLINIC_OR_DEPARTMENT_OTHER)
Admission: EM | Admit: 2020-08-12 | Discharge: 2020-08-12 | Disposition: A | Discharge status: Home / Self Care | Payer: BC Managed Care – PPO | Attending: Emergency Medicine | Admitting: Emergency Medicine

## 2020-08-12 ENCOUNTER — Encounter (HOSPITAL_BASED_OUTPATIENT_CLINIC_OR_DEPARTMENT_OTHER): Payer: Self-pay

## 2020-08-12 ENCOUNTER — Other Ambulatory Visit: Payer: Self-pay

## 2020-08-12 DIAGNOSIS — F17290 Nicotine dependence, other tobacco product, uncomplicated: Secondary | ICD-10-CM | POA: Insufficient documentation

## 2020-08-12 DIAGNOSIS — M79675 Pain in left toe(s): Secondary | ICD-10-CM | POA: Diagnosis present

## 2020-08-12 NOTE — ED Provider Notes (Signed)
[facility] EMERGENCY DEPARTMENT Provider Note   CSN: [id number] Arrival date & time: 08/12/20  1630     History Chief Complaint  Patient presents with   Toe Pain    Evan Rocha is a 35 y.o. male.  Patient with left big toe pain for last several weeks.  Denies any trauma.  On any of her shoes about 4 weeks ago and then developed pain.  No swelling, no fever.  No history of gout.  The history is provided by the patient.  Toe Pain This is a new problem. Episode onset: 3 weeks. The problem occurs daily. Nothing aggravates the symptoms. Nothing relieves the symptoms. He has tried nothing for the symptoms. The treatment provided no relief.      Past Medical History:  Diagnosis Date   Head injury 2016    There are no problems to display for this patient.   History reviewed. No pertinent surgical history.     Family History  Problem Relation Age of Onset   Hypertension Other     Social History   Tobacco Use   Smoking status: Every Day    Types: Cigars   Smokeless tobacco: Never  Vaping Use   Vaping Use: Never used  Substance Use Topics   Alcohol use: Yes    Comment: occasionally   Drug use: No    Home Medications Prior to Admission medications   Medication Sig Start Date End Date Taking? Authorizing Provider  acetaminophen (TYLENOL) 500 MG tablet Take 2 tablets (1,000 mg total) by mouth every 6 (six) hours as needed. 12/30/15   Cheri Fowler, PA-C  azithromycin (ZITHROMAX) 250 MG tablet Take 1 tablet (250 mg total) by mouth daily. Take first 2 tablets together, then 1 every day until finished. 03/21/15   Marlon Pel, PA-C  naproxen (NAPROSYN) 500 MG tablet Take 1 tablet (500 mg total) by mouth 2 (two) times daily. 12/30/15   Cheri Fowler, PA-C    Allergies    Patient has no known allergies.  Review of Systems   Review of Systems  Musculoskeletal:  Positive for arthralgias and gait problem. Negative for back pain, joint swelling, myalgias, neck  pain and neck stiffness.  Skin:  Negative for color change, pallor, rash and wound.  Neurological:  Negative for weakness and numbness.   Physical Exam Updated Vital Signs BP (!) 129/93 (BP Location: Left Arm)   Pulse (!) 109   Temp 98.7 F (37.1 C) (Oral)   Resp 16   Ht 5\' 10"  (1.778 m)   Wt 120.2 kg   SpO2 98%   BMI 38.02 kg/m   Physical Exam Constitutional:      General: He is not in acute distress.    Appearance: He is not ill-appearing.  Musculoskeletal:        General: Tenderness present. No swelling or deformity. Normal range of motion.     Comments: Tenderness to the tip of the left big toe but there is no swelling, redness, deformity  Skin:    General: Skin is warm.     Capillary Refill: Capillary refill takes less than 2 seconds.  Neurological:     Mental Status: He is alert.     Sensory: No sensory deficit.     Motor: No weakness.    ED Results / Procedures / Treatments   Labs (all labs ordered are listed, but only abnormal results are displayed) Labs Reviewed - No data to display  EKG None  Radiology DG Foot Complete  Left  Result Date: 08/12/2020 CLINICAL DATA:  Distal left great toe pain and swelling for 3 weeks EXAM: LEFT FOOT - COMPLETE 3+ VIEW COMPARISON:  None. FINDINGS: Frontal, oblique, lateral views of the left foot are obtained. There is an oblique lucency through the distal tuft of the first distal phalanx, with surrounding osteopenic, which could reflect subacute fracture. If there are surrounding signs of inflammation or infection, osteomyelitis could be considered. No other acute displaced fractures. Prominent osteophyte off the dorsal distal margin of the talus. Joint spaces are well preserved. IMPRESSION: 1. Osteopenia with oblique lucency through the distal tuft of the first distal phalanx. Differential diagnosis includes subacute fracture versus infection. Please correlate with clinical presentation. Electronically Signed   By: Sharlet Salina  M.D.   On: 08/12/2020 17:48    Procedures Procedures   Medications Ordered in ED Medications - No data to display  ED Course  I have reviewed the triage vital signs and the nursing notes.  Pertinent labs & imaging results that were available during my care of the patient were reviewed by me and considered in my medical decision making (see chart for details).    MDM Rules/Calculators/A&P                           Evan Rocha is here with left toe pain.  Pain to the left big toe for the last several weeks.  Normal vitals.  No fever.  Does not appear to have infectious process or inflammatory process such as gout.  Having pain at the tip of his left toe.   X-ray shows concern for oblique lucency through the distal tuft of the first phalanx.  Overall favor subacute fracture versus infection.  He has no redness or swelling.  No fever.  No diabetes history.  Have very low concern for infectious process.  We will place him in a postop shoe and have him follow-up with orthopedics.  This chart was dictated using voice recognition software.  Despite best efforts to proofread,  errors can occur which can change the documentation meaning.   Final Clinical Impression(s) / ED Diagnoses Final diagnoses:  Pain of toe of left foot    Rx / DC Orders ED Discharge Orders     None        Virgina Norfolk, DO 08/12/20 1757

## 2020-08-12 NOTE — ED Notes (Signed)
Patient c/o left great toe pain x 3 weeks, denies trauma.

## 2020-08-12 NOTE — ED Triage Notes (Signed)
Pt c/o left great toe pain x 3 weeks-denies known injury-NAD-steady gait

## 2020-08-12 NOTE — Discharge Instructions (Signed)
Suspect that there is a mild toe fracture.  Follow-up with orthopedics.  As discussed I have a very low suspicion for infectious process.  Wear postop shoe at all times.  No strenuous activities.

## 2020-08-12 NOTE — ED Notes (Signed)
ED Provider at bedside. 

## 2020-08-18 ENCOUNTER — Encounter: Payer: Self-pay | Admitting: Orthopaedic Surgery

## 2020-08-19 ENCOUNTER — Other Ambulatory Visit: Payer: Self-pay

## 2020-08-19 ENCOUNTER — Ambulatory Visit (INDEPENDENT_AMBULATORY_CARE_PROVIDER_SITE_OTHER): Payer: BC Managed Care – PPO | Admitting: Family Medicine

## 2020-08-19 ENCOUNTER — Encounter: Payer: Self-pay | Admitting: Family Medicine

## 2020-08-19 DIAGNOSIS — M79675 Pain in left toe(s): Secondary | ICD-10-CM | POA: Diagnosis not present

## 2020-08-19 NOTE — Patient Instructions (Signed)
     Vitamin D3:  Take 5,000 IU daily    Stop smoking if able.  Restricts blood flow to bones and joints.

## 2020-08-19 NOTE — Progress Notes (Signed)
   Office Visit Note   Patient: Evan Rocha           Date of Birth: [birth year]           MRN: [id number] Visit Date: 08/19/2020 Requested by: No referring provider defined for this encounter. PCP: Patient, No Pcp Per (Inactive)  Subjective: Chief Complaint  Patient presents with   Left Great Toe - Pain    Pain in the tip of the toe. NKI. Does wear steel-toed boots at work.  Was seen a week ago in the ED and had xrays. Was placed in a post op shoe, FWB. Still hurts intermittently, especially if anything touches the distal tip.    HPI: He is here with left great toe pain.  Symptoms started about 3 or 4 weeks ago.  He cannot recall a definite injury, but he does remember hitting his toe against a dumbbell that was on his bedroom floor.  His pain got steadily worse to the point that he was limping at work in his steel toed boots.  He went to the hospital recently and had x-rays obtained which showed a questionable tuft fracture.  He is wearing a postop shoe and is out of work now, pain is improving.  No previous problems with his toe.  No history of gout.  He does smoke cigarettes.              ROS:   All other systems were reviewed and are negative.  Objective: Vital Signs: There were no vitals taken for this visit.  Physical Exam:  General:  Alert and oriented, in no acute distress. Pulm:  Breathing unlabored. Psy:  Normal mood, congruent affect. Skin: I carefully looked for a winter and did not find 1. Left great toe: He has intact flexor and extensor tendon function.  He is tender only to palpation of the tip of his toe, mostly on the medial side.  No pain around the IP joint.  Imaging: No results found.  Assessment & Plan: Left great toe pain, with possible subtle tuft fracture.  Cannot rule out sliver -Continue with postop shoe, paperwork completed for his work.  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  Encouraged him to quit  smoking.     Procedures: No procedures performed        PMFS History: There are no problems to display for this patient.  Past Medical History:  Diagnosis Date   Head injury 2016    Family History  Problem Relation Age of Onset   Hypertension Other     History reviewed. No pertinent surgical history. Social History   Occupational History   Not on file  Tobacco Use   Smoking status: Every Day    Types: Cigars   Smokeless tobacco: Never  Vaping Use   Vaping Use: Never used  Substance and Sexual Activity   Alcohol use: Yes    Comment: occasionally   Drug use: No   Sexual activity: Yes

## 2020-08-22 ENCOUNTER — Telehealth: Payer: Self-pay | Admitting: Family Medicine

## 2020-08-22 ENCOUNTER — Ambulatory Visit: Payer: BC Managed Care – PPO | Admitting: Orthopaedic Surgery

## 2020-08-22 NOTE — Telephone Encounter (Signed)
Received $25.00 cash,medical records release form and FMLA/Disability Paperwork     Forwarding to [employer] today

## 2020-09-08 ENCOUNTER — Other Ambulatory Visit: Payer: Self-pay

## 2020-09-08 ENCOUNTER — Ambulatory Visit: Payer: Self-pay

## 2020-09-08 ENCOUNTER — Ambulatory Visit (INDEPENDENT_AMBULATORY_CARE_PROVIDER_SITE_OTHER): Payer: BC Managed Care – PPO | Admitting: Family Medicine

## 2020-09-08 DIAGNOSIS — M79675 Pain in left toe(s): Secondary | ICD-10-CM | POA: Diagnosis not present

## 2020-09-08 NOTE — Progress Notes (Signed)
   Office Visit Note   Patient: Evan Rocha           Date of Birth: 1985/10/25           MRN: [id number] Visit Date: 09/08/2020 Requested by: No referring provider defined for this encounter. PCP: Patient, No Pcp Per (Inactive)  Subjective: Chief Complaint  Patient presents with   Left Great Toe - Follow-up, Pain    HPI: He is here for follow-up right great toe pain with possible tuft fracture.  Pain is improving in his postop shoe.                ROS:   All other systems were reviewed and are negative.  Objective: Vital Signs: There were no vitals taken for this visit.  Physical Exam:  General:  Alert and oriented, in no acute distress. Pulm:  Breathing unlabored. Psy:  Normal mood, congruent affect. Skin: No erythema or warmth Right great toe: There is only very minimal tenderness to deep palpation of the distal phalanx.  Intact tendon function.    Imaging: XR Toe Great Right  Result Date: 09/08/2020 X-rays of the right great toe show good callus formation at the tuft fracture site.  No displacement.   Assessment & Plan: Clinically healing right great toe tuft fracture -Okay to wean from his postop shoe over the next week, and return to work on August 22 regular duty in his steel toed shoes.  Follow-up as needed.     Procedures: No procedures performed        PMFS History: There are no problems to display for this patient.  Past Medical History:  Diagnosis Date   Head injury 2016    Family History  Problem Relation Age of Onset   Hypertension Other     No past surgical history on file. Social History   Occupational History   Not on file  Tobacco Use   Smoking status: Every Day    Types: Cigars   Smokeless tobacco: Never  Vaping Use   Vaping Use: Never used  Substance and Sexual Activity   Alcohol use: Yes    Comment: occasionally   Drug use: No   Sexual activity: Yes

## 2020-09-09 ENCOUNTER — Telehealth: Payer: Self-pay | Admitting: Family Medicine

## 2020-09-09 NOTE — Telephone Encounter (Signed)
OV notes faxed to [employer] [phone] claim# [id number]

## 2021-08-06 ENCOUNTER — Other Ambulatory Visit: Payer: Self-pay

## 2021-08-06 ENCOUNTER — Emergency Department (HOSPITAL_BASED_OUTPATIENT_CLINIC_OR_DEPARTMENT_OTHER)
Admission: EM | Admit: 2021-08-06 | Discharge: 2021-08-06 | Discharge status: Against Medical Advice | Payer: BC Managed Care – PPO | Attending: Emergency Medicine | Admitting: Emergency Medicine

## 2021-08-06 DIAGNOSIS — S8991XA Unspecified injury of right lower leg, initial encounter: Secondary | ICD-10-CM | POA: Diagnosis not present

## 2021-08-06 DIAGNOSIS — Y9339 Activity, other involving climbing, rappelling and jumping off: Secondary | ICD-10-CM | POA: Insufficient documentation

## 2021-08-06 DIAGNOSIS — S6992XA Unspecified injury of left wrist, hand and finger(s), initial encounter: Secondary | ICD-10-CM | POA: Diagnosis present

## 2021-08-06 DIAGNOSIS — S4992XA Unspecified injury of left shoulder and upper arm, initial encounter: Secondary | ICD-10-CM | POA: Diagnosis not present

## 2021-08-06 DIAGNOSIS — Z5321 Procedure and treatment not carried out due to patient leaving prior to being seen by health care provider: Secondary | ICD-10-CM | POA: Diagnosis not present

## 2021-08-06 DIAGNOSIS — Z4800 Encounter for change or removal of nonsurgical wound dressing: Secondary | ICD-10-CM | POA: Insufficient documentation

## 2021-08-06 DIAGNOSIS — X58XXXA Exposure to other specified factors, initial encounter: Secondary | ICD-10-CM | POA: Insufficient documentation

## 2021-08-06 NOTE — ED Triage Notes (Signed)
Patient presents to ED via POV from home. Reports injury to left hand, left arm and right leg from climbing a fence on Tuesday.

## 2021-08-10 ENCOUNTER — Other Ambulatory Visit: Payer: Self-pay

## 2021-08-10 ENCOUNTER — Emergency Department (HOSPITAL_BASED_OUTPATIENT_CLINIC_OR_DEPARTMENT_OTHER)
Admission: EM | Admit: 2021-08-10 | Discharge: 2021-08-10 | Discharge status: Absent without leave | Payer: BC Managed Care – PPO | Source: Home / Self Care

## 2022-01-12 ENCOUNTER — Encounter (HOSPITAL_BASED_OUTPATIENT_CLINIC_OR_DEPARTMENT_OTHER): Payer: Self-pay | Admitting: Emergency Medicine

## 2022-01-12 ENCOUNTER — Other Ambulatory Visit: Payer: Self-pay

## 2022-01-12 ENCOUNTER — Emergency Department (HOSPITAL_BASED_OUTPATIENT_CLINIC_OR_DEPARTMENT_OTHER)
Admission: EM | Admit: 2022-01-12 | Discharge: 2022-01-12 | Disposition: A | Discharge status: Home / Self Care | Payer: BC Managed Care – PPO | Attending: Emergency Medicine | Admitting: Emergency Medicine

## 2022-01-12 DIAGNOSIS — F17210 Nicotine dependence, cigarettes, uncomplicated: Secondary | ICD-10-CM | POA: Insufficient documentation

## 2022-01-12 DIAGNOSIS — Z1152 Encounter for screening for COVID-19: Secondary | ICD-10-CM | POA: Insufficient documentation

## 2022-01-12 DIAGNOSIS — J069 Acute upper respiratory infection, unspecified: Secondary | ICD-10-CM | POA: Insufficient documentation

## 2022-01-12 LAB — RESP PANEL BY RT-PCR (RSV, FLU A&B, COVID)  RVPGX2
Influenza A by PCR: NEGATIVE
Influenza B by PCR: NEGATIVE
Resp Syncytial Virus by PCR: NEGATIVE
SARS Coronavirus 2 by RT PCR: NEGATIVE

## 2022-01-12 LAB — GROUP A STREP BY PCR: Group A Strep by PCR: NOT DETECTED

## 2022-01-12 MED ORDER — PREDNISONE 10 MG PO TABS: 40.0000 mg | ORAL_TABLET | Freq: Every day | ORAL | 0 refills | Status: AC

## 2022-01-12 MED ORDER — PREDNISONE 50 MG PO TABS
60.0000 mg | ORAL_TABLET | Freq: Once | ORAL | Status: AC
  Administered 2022-01-12: 60 mg via ORAL
  Filled 2022-01-12: qty 1

## 2022-01-12 NOTE — ED Triage Notes (Signed)
Pt reports he thinks tonsils are swollen x 1-2 wks; denies pain; feels like he has trouble breathing at night when lying down

## 2022-01-12 NOTE — Discharge Instructions (Signed)
Take the prednisone as directed for the next 5 days.  First dose given here tonight.  Return for any new or worse symptoms.  Also recommend extra strength Tylenol and Mucinex DM 12-hour tablet.  Work note provided.  COVID flu and RSV testing negative and strep testing negative.  Symptoms consistent with a viral upper respiratory infection.

## 2022-01-12 NOTE — ED Notes (Signed)
D/c paperwork reviewed with pt, including prescription.  All questions addressed prior to d/c, pt ambulatory to ED exit on RA, NAD .

## 2022-01-12 NOTE — ED Provider Notes (Signed)
[facility] EMERGENCY DEPARTMENT Provider Note   CSN: [id number] Arrival date & time: 01/12/22  1527     History  Chief Complaint  Patient presents with   Sore Throat    Evan Rocha is a 36 y.o. male.  Patient with a complaint of cough congestion sore throat some difficulty swallowing.  Feels like his uvula is enlarged.  Symptoms ongoing for a little over a week.  Patient denies any nausea vomiting or diarrhea.  Denies any fevers.  Smokes cigars every day.  Past medical history significant for head injury in 2016.       Home Medications Prior to Admission medications   Medication Sig Start Date End Date Taking? Authorizing Provider  predniSONE (DELTASONE) 10 MG tablet Take 4 tablets (40 mg total) by mouth daily. 01/12/22  Yes Vanetta Mulders, MD      Allergies    Patient has no known allergies.    Review of Systems   Review of Systems  Constitutional:  Negative for chills and fever.  HENT:  Positive for congestion, sore throat and trouble swallowing. Negative for rhinorrhea.   Eyes:  Negative for visual disturbance.  Respiratory:  Positive for cough. Negative for shortness of breath.   Cardiovascular:  Negative for chest pain and leg swelling.  Gastrointestinal:  Negative for abdominal pain, diarrhea, nausea and vomiting.  Genitourinary:  Negative for dysuria.  Musculoskeletal:  Negative for back pain and neck pain.  Skin:  Negative for rash.  Neurological:  Negative for dizziness, light-headedness and headaches.  Hematological:  Does not bruise/bleed easily.  Psychiatric/Behavioral:  Negative for confusion.     Physical Exam Updated Vital Signs BP (!) 140/105 (BP Location: Right Arm)   Pulse 96   Temp 97.8 F (36.6 C)   Resp 18   Ht 1.778 m (5\' 10" )   Wt 113.4 kg   SpO2 97%   BMI 35.87 kg/m  Physical Exam Vitals and nursing note reviewed.  Constitutional:      General: He is not in acute distress.    Appearance: He is well-developed.   HENT:     Head: Normocephalic and atraumatic.     Mouth/Throat:     Mouth: Mucous membranes are moist.     Pharynx: Uvula midline. Pharyngeal swelling and posterior oropharyngeal erythema present. No oropharyngeal exudate or uvula swelling.     Tonsils: No tonsillar exudate or tonsillar abscesses.  Eyes:     Conjunctiva/sclera: Conjunctivae normal.     Pupils: Pupils are equal, round, and reactive to light.  Cardiovascular:     Rate and Rhythm: Normal rate and regular rhythm.     Heart sounds: No murmur heard. Pulmonary:     Effort: Pulmonary effort is normal. No respiratory distress.     Breath sounds: Normal breath sounds. No stridor. No wheezing, rhonchi or rales.  Abdominal:     Palpations: Abdomen is soft.     Tenderness: There is no abdominal tenderness.  Musculoskeletal:        General: No swelling.     Cervical back: Normal range of motion and neck supple.  Skin:    General: Skin is warm and dry.     Capillary Refill: Capillary refill takes less than 2 seconds.  Neurological:     Mental Status: He is alert.  Psychiatric:        Mood and Affect: Mood normal.     ED Results / Procedures / Treatments   Labs (all labs ordered are listed, but  only abnormal results are displayed) Labs Reviewed  RESP PANEL BY RT-PCR (RSV, FLU A&B, COVID)  RVPGX2  GROUP A STREP BY PCR    EKG None  Radiology No results found.  Procedures Procedures    Medications Ordered in ED Medications  predniSONE (DELTASONE) tablet 60 mg (has no administration in time range)    ED Course/ Medical Decision Making/ A&P                           Medical Decision Making Risk Prescription drug management.   Patient's oropharynx with some erythema no exudate uvula is midline but may be some slight swelling to it but not significant enlarged.  Patient's voice is fine.  Patient swallowing fine lungs clear bilaterally.  Strep test negative COVID influenza and RSV are negative.  Consistent  with a viral upper respiratory infection most of his symptoms now are pharyngeal.  But no complications no concerns for abscess.  Nontoxic no acute distress.  Will treat symptomatically with prednisone and recommend Mucinex DM and extra strength Tylenol.    Work note provided.  Patient will return for any new or worse symptoms. Final Clinical Impression(s) / ED Diagnoses Final diagnoses:  Viral upper respiratory tract infection    Rx / DC Orders ED Discharge Orders          Ordered    predniSONE (DELTASONE) 10 MG tablet  Daily        01/12/22 1748              Vanetta Mulders, MD 01/12/22 1754

## 2022-01-21 ENCOUNTER — Emergency Department (HOSPITAL_COMMUNITY): Admission: EM | Admit: 2022-01-21 | Discharge: 2022-01-21 | Discharge status: Against Medical Advice | Payer: Self-pay

## 2022-01-21 ENCOUNTER — Other Ambulatory Visit: Payer: Self-pay

## 2022-01-21 NOTE — ED Notes (Signed)
Pt stated they needed a doctors note, explained to pt that you have to see a doctor to receive a doctors note and pt decided to leave.

## 2022-01-22 ENCOUNTER — Other Ambulatory Visit: Payer: Self-pay

## 2022-01-22 ENCOUNTER — Emergency Department (HOSPITAL_COMMUNITY)
Admission: EM | Admit: 2022-01-22 | Discharge: 2022-01-22 | Disposition: A | Discharge status: Home / Self Care | Payer: Self-pay | Attending: Emergency Medicine | Admitting: Emergency Medicine

## 2022-01-22 DIAGNOSIS — R682 Dry mouth, unspecified: Secondary | ICD-10-CM | POA: Insufficient documentation

## 2022-01-22 DIAGNOSIS — R000 Tachycardia, unspecified: Secondary | ICD-10-CM | POA: Insufficient documentation

## 2022-01-22 DIAGNOSIS — J029 Acute pharyngitis, unspecified: Secondary | ICD-10-CM | POA: Insufficient documentation

## 2022-01-22 NOTE — ED Triage Notes (Signed)
Pt to ED requesting work note to return back to work on Tuesday . Reports seen at [facility] for swollen tonsils and started on steroids 10 days ago.

## 2022-01-22 NOTE — ED Notes (Signed)
Pt d/c home per MD order. Discharge summary reviewed, pt verbalizes understanding. Ambulatory off unit. No s/s of acute distress noted at discharge.  °

## 2022-01-22 NOTE — ED Provider Notes (Signed)
[facility] EMERGENCY DEPARTMENT Provider Note   CSN: [id number] Arrival date & time: 01/22/22  1048     History  Chief Complaint  Patient presents with   doctor note    Evan Rocha is a 36 y.o. male.  The history is provided by the patient and medical records. No language interpreter was used.     36 year old male presenting requesting for work note.  Patient states he has had sore throat for nearly a month.  Recently he was seen and evaluated by a provider and was prescribed prednisone for 10 days.  He mention after taking the medication he started to notice an improvement of his sore throat.  Since he did miss yesterday he request to see if he can get a doctor's note to return back to work.  He does not endorse any fever, heat or cold intolerance, heart palpitation or any pain.  Home Medications Prior to Admission medications   Medication Sig Start Date End Date Taking? Authorizing Provider  predniSONE (DELTASONE) 10 MG tablet Take 4 tablets (40 mg total) by mouth daily. 01/12/22   Vanetta Mulders, MD      Allergies    Patient has no known allergies.    Review of Systems   Review of Systems  All other systems reviewed and are negative.   Physical Exam Updated Vital Signs BP (!) 161/117 (BP Location: Right Arm)   Pulse (!) 128   Temp 99 F (37.2 C)   Resp 16   Ht 5\' 10"  (1.778 m)   Wt 113.4 kg   SpO2 96%   BMI 35.87 kg/m  Physical Exam Vitals and nursing note reviewed.  Constitutional:      General: He is not in acute distress.    Appearance: He is well-developed.  HENT:     Head: Atraumatic.     Mouth/Throat:     Mouth: Mucous membranes are dry.     Pharynx: No oropharyngeal exudate or posterior oropharyngeal erythema.     Comments: Throat exam unremarkable, uvula midline no tonsillar enlargement no exudates, no trismus, no stridor. Eyes:     Conjunctiva/sclera: Conjunctivae normal.  Neck:     Vascular: No carotid bruit.      Comments: No thyromegaly Cardiovascular:     Rate and Rhythm: Tachycardia present.  Pulmonary:     Effort: Pulmonary effort is normal.     Breath sounds: Normal breath sounds.  Musculoskeletal:     Cervical back: Normal range of motion and neck supple. No rigidity or tenderness.  Lymphadenopathy:     Cervical: No cervical adenopathy.  Skin:    Findings: No rash.  Neurological:     Mental Status: He is alert.     ED Results / Procedures / Treatments   Labs (all labs ordered are listed, but only abnormal results are displayed) Labs Reviewed - No data to display  EKG None  Radiology No results found.  Procedures Procedures    Medications Ordered in ED Medications - No data to display  ED Course/ Medical Decision Making/ A&P                           Medical Decision Making  BP (!) 161/117 (BP Location: Right Arm)   Pulse (!) 128   Temp 99 F (37.2 C)   Resp 16   Ht 5\' 10"  (1.778 m)   Wt 113.4 kg   SpO2 96%   BMI 35.87  kg/m   21:42 AM 36 year old male presenting requesting for work note.  Patient states he has had sore throat for nearly a month.  Recently he was seen and evaluated by a provider and was prescribed prednisone for 10 days.  He mention after taking the medication he started to notice an improvement of his sore throat.  Since he did miss yesterday he request to see if he can get a doctor's note to return back to work.  He does not endorse any fever, heat or cold intolerance, heart palpitation or any pain.  On exam, patient is sitting in the chair appears to be in no acute discomfort.  Throat exam unremarkable, no evidence of tonsillar abscess tonsillitis.  Patient however is tachycardic with heart rate of 128.  Recheck heart rate is 120.  His oral temp is 99.  Heart rate may be due to low-grade fever however I also discussed of potential thyroid disease causing it.  I offer further workup including TSH labs, EKG but at this time patient declined stating  that he feels better he would just like a work note.  He understand the risk and benefit of further testing and he felt comfortable returning if his symptoms worsen.  Work note provided.  Reviewed EMR, patient was seen on December 19 for sore throat.  Strep COVID flu RSV test came back negative.        Final Clinical Impression(s) / ED Diagnoses Final diagnoses:  Sore throat    Rx / DC Orders ED Discharge Orders     None         Fayrene Helper, PA-C 01/22/22 1119    Pricilla Loveless, MD 01/22/22 1546

## 2022-01-22 NOTE — Discharge Instructions (Signed)
You were giving a work note to return back to work.  Please note that your heart rate is fast today, and sometimes that may indicate thyroid disease.  If your symptoms progressed and if you have concern please return to the ER for further assessments of your condition.
